# Patient Record
Sex: Female | Born: 1980 | Race: White | Hispanic: No | Marital: Married | Smoking: Current every day smoker
Health system: Southern US, Community
[De-identification: ages and names within clinical notes are randomized; demographics above are authoritative.]

## PROBLEM LIST (undated history)

## (undated) HISTORY — PX: TUBAL LIGATION: SHX77

---

## 2004-03-15 ENCOUNTER — Encounter: Admission: RE | Admit: 2004-03-15 | Discharge: 2004-03-15 | Payer: Self-pay | Admitting: Family Medicine

## 2004-03-29 ENCOUNTER — Encounter: Admission: RE | Admit: 2004-03-29 | Discharge: 2004-03-29 | Payer: Self-pay | Admitting: Family Medicine

## 2011-11-15 ENCOUNTER — Inpatient Hospital Stay (HOSPITAL_COMMUNITY)
Admission: AD | Admit: 2011-11-15 | Discharge: 2011-11-15 | Disposition: A | Discharge status: Home / Self Care | Payer: BC Managed Care – PPO | Source: Ambulatory Visit | Attending: Obstetrics & Gynecology | Admitting: Obstetrics & Gynecology

## 2011-11-15 ENCOUNTER — Inpatient Hospital Stay (HOSPITAL_COMMUNITY): Payer: BC Managed Care – PPO

## 2011-11-15 ENCOUNTER — Encounter (HOSPITAL_COMMUNITY): Payer: Self-pay

## 2011-11-15 DIAGNOSIS — O209 Hemorrhage in early pregnancy, unspecified: Secondary | ICD-10-CM | POA: Insufficient documentation

## 2011-11-15 LAB — ABO/RH: ABO/RH(D): A NEG

## 2011-11-15 LAB — GC/CHLAMYDIA PROBE AMP, GENITAL
Chlamydia, DNA Probe: NEGATIVE
GC Probe Amp, Genital: NEGATIVE

## 2011-11-15 LAB — CBC
Hemoglobin: 12.4 g/dL (ref 12.0–15.0)
MCH: 29.3 pg (ref 26.0–34.0)
RBC: 4.23 MIL/uL (ref 3.87–5.11)
WBC: 10.2 10*3/uL (ref 4.0–10.5)

## 2011-11-15 LAB — URINALYSIS, ROUTINE W REFLEX MICROSCOPIC
Bilirubin Urine: NEGATIVE
Nitrite: NEGATIVE
Specific Gravity, Urine: 1.02 (ref 1.005–1.030)
pH: 6 (ref 5.0–8.0)

## 2011-11-15 LAB — WET PREP, GENITAL: Trich, Wet Prep: NONE SEEN

## 2011-11-15 MED ORDER — RHO D IMMUNE GLOBULIN 1500 UNIT/2ML IJ SOLN
300.0000 ug | Freq: Once | INTRAMUSCULAR | Status: AC
  Administered 2011-11-15: 300 ug via INTRAMUSCULAR
  Filled 2011-11-15: qty 2

## 2011-11-15 NOTE — MAU Note (Signed)
Patient is here with c/o sudden onset of vaginal bleeding. She states that she noticed after voiding at home about ago.

## 2011-11-15 NOTE — Discharge Instructions (Signed)
Vaginal Bleeding During Pregnancy  A small amount of bleeding from the vagina can happen anytime during pregnancy. Be sure to tell your doctor about all vaginal bleeding.   HOME CARE   Get plenty of rest and sleep.   Count the number of pads you use each day. Do not use tampons.   Save any tissue you pass for your doctor to see.   Do not exercise   Do not do any heavy lifting.   Avoid going up and down stairs. If you must climb stairs, go slowly.   Do not have sex (intercourse) or orgasms until approved by your doctor.   Do not douche.   Only take medicine as told by your doctor. Do not take aspirin.   Eat healthy.   Always keep your follow-up appointments.  GET HELP RIGHT AWAY IF:    You feel the baby moving less or not moving at all.   The bleeding gets worse.   You have very painful cramps or pain in your stomach or back.   You pass large clots or anything that looks like tissue.   You have a temperature by mouth above 102 F (38.9 C).   You feel very weak.   You have chills.   You feel dizzy or pass out (faint).   You have a gush of fluid from the vagina.  MAKE SURE YOU:    Understand these instructions.   Will watch your condition.   Will get help right away if you are not doing well or get worse.  Document Released: 04/29/2008 Document Revised: 07/10/2011 Document Reviewed: 06/26/2009  Exit[employer] Patient Information 2012 [employer].

## 2011-11-15 NOTE — MAU Provider Note (Signed)
History   Pt presents today c/o vag bleeding in early preg. She states she has had prenatal care out of town and she went to an ER close to her OB practice but was told the wait was over 4 hours so she decided to come to [facility]. She denies any abd pain, vag irritation, dysuria, fever, or any other sx at this time. She states she has not had intercourse in over a month.  CSN: [id number]  Arrival date and time: 11/15/11 7829   First Provider Initiated Contact with Patient 11/15/11 0043      Chief Complaint  Patient presents with  . Vaginal Bleeding   HPI  OB History    Grav Para Term Preterm Abortions TAB SAB Ect Mult Living   2    1 1           History reviewed. No pertinent past medical history.  History reviewed. No pertinent past surgical history.  History reviewed. No pertinent family history.  History  Substance Use Topics  . Smoking status: Current Everyday Smoker -- 0.5 packs/day  . Smokeless tobacco: Not on file  . Alcohol Use: No    Allergies:  Allergies  Allergen Reactions  . Penicillins Hives  . Percocet (Oxycodone-Acetaminophen) Nausea And Vomiting    No prescriptions prior to admission    Review of Systems  Constitutional: Negative for fever and chills.  Eyes: Negative for blurred vision and double vision.  Cardiovascular: Negative for chest pain and palpitations.  Gastrointestinal: Negative for nausea, vomiting, abdominal pain, diarrhea and constipation.  Genitourinary: Negative for dysuria, urgency, frequency and hematuria.  Neurological: Negative for dizziness and headaches.  Psychiatric/Behavioral: Negative for depression and suicidal ideas.   Physical Exam   Blood pressure 120/82, pulse 96, temperature 97.2 F (36.2 C), temperature source Oral, resp. rate 18, height 5\' 2"  (1.575 m), weight 199 lb 6 oz (90.436 kg), last menstrual period 09/06/2011.  Physical Exam  Nursing note and vitals reviewed. Constitutional: She is oriented to  person, place, and time. She appears well-developed and well-nourished. No distress.  HENT:  Head: Normocephalic and atraumatic.  Eyes: EOM are normal. Pupils are equal, round, and reactive to light.  GI: Soft. She exhibits no distension and no mass. There is no tenderness. There is no rebound and no guarding.  Genitourinary: There is bleeding around the vagina. Vaginal discharge found.       Minimal amount of dark red blood in the vag vault. Cervix Lg/closed.   Neurological: She is alert and oriented to person, place, and time.  Skin: Skin is warm and dry. She is not diaphoretic.  Psychiatric: She has a normal mood and affect. Her behavior is normal. Judgment and thought content normal.    MAU Course  Procedures  Wet prep and GC/Chlamydia cultures done.  Results for orders placed during the hospital encounter of 11/15/11 (from the past 24 hour(s))  URINALYSIS, ROUTINE W REFLEX MICROSCOPIC     Status: Abnormal   Collection Time   11/15/11 12:16 AM      Component Value Range   Color, Urine YELLOW  YELLOW    APPearance CLEAR  CLEAR    Specific Gravity, Urine 1.020  1.005 - 1.030    pH 6.0  5.0 - 8.0    Glucose, UA NEGATIVE  NEGATIVE (mg/dL)   Hgb urine dipstick LARGE (*) NEGATIVE    Bilirubin Urine NEGATIVE  NEGATIVE    Ketones, ur NEGATIVE  NEGATIVE (mg/dL)   Protein, ur NEGATIVE  NEGATIVE (mg/dL)   Urobilinogen, UA 0.2  0.0 - 1.0 (mg/dL)   Nitrite NEGATIVE  NEGATIVE    Leukocytes, UA SMALL (*) NEGATIVE   URINE MICROSCOPIC-ADD ON     Status: Abnormal   Collection Time   11/15/11 12:16 AM      Component Value Range   Squamous Epithelial / LPF MANY (*) RARE    WBC, UA 3-6  <3 (WBC/hpf)   RBC / HPF 3-6  <3 (RBC/hpf)   Bacteria, UA RARE  RARE    Urine-Other MUCOUS PRESENT    POCT PREGNANCY, URINE     Status: Abnormal   Collection Time   11/15/11 12:40 AM      Component Value Range   Preg Test, Ur POSITIVE (*) NEGATIVE   WET PREP, GENITAL     Status: Abnormal   Collection Time    11/15/11 12:40 AM      Component Value Range   Yeast Wet Prep HPF POC NONE SEEN  NONE SEEN    Trich, Wet Prep NONE SEEN  NONE SEEN    Clue Cells Wet Prep HPF POC FEW (*) NONE SEEN    WBC, Wet Prep HPF POC FEW (*) NONE SEEN   CBC     Status: Normal   Collection Time   11/15/11 12:41 AM      Component Value Range   WBC 10.2  4.0 - 10.5 (K/uL)   RBC 4.23  3.87 - 5.11 (MIL/uL)   Hemoglobin 12.4  12.0 - 15.0 (g/dL)   HCT 16.1  09.6 - 04.5 (%)   MCV 89.4  78.0 - 100.0 (fL)   MCH 29.3  26.0 - 34.0 (pg)   MCHC 32.8  30.0 - 36.0 (g/dL)   RDW 40.9  81.1 - 91.4 (%)   Platelets 190  150 - 400 (K/uL)  ABO/RH     Status: Normal   Collection Time   11/15/11 12:41 AM      Component Value Range   ABO/RH(D) A NEG    RH IG WORKUP     Status: Normal (Preliminary result)   Collection Time   11/15/11 12:41 AM      Component Value Range   Gestational Age(Wks) 10     ABO/RH(D) A NEG     Antibody Screen NEG     Unit Number [id number]     Blood Component Type RHIG     Unit division 00     Status of Unit ISSUED     Transfusion Status OK TO TRANSFUSE     US shows single IUP consistent with EDC. No obvious bleeding or etiology for bleeding noted.  Rhophylac given.  Assessment and Plan  Bleeding in preg: discussed with pt at length. She will f/u with her OB provider. Discussed diet, activity, risks, and precautions. She will remain on pelvic rest.  Clinton Gallant. Yelina Sarratt III, DrHSc, MPAS, PA-C  11/15/2011, 12:46 AM

## 2011-11-16 LAB — RH IG WORKUP (INCLUDES ABO/RH)
ABO/RH(D): A NEG
Antibody Screen: NEGATIVE
Gestational Age(Wks): 10

## 2011-11-17 LAB — URINE CULTURE: Culture  Setup Time: 201304131127

## 2011-11-18 NOTE — MAU Provider Note (Signed)
Attestation of Attending Supervision of Advanced Practitioner: Evaluation and management procedures were performed by the OB Fellow/PA/CNM/NP under my supervision and collaboration. Chart reviewed, and agree with management and plan.  Gabriella Woodhead, M.D. 11/18/2011 11:31 AM   

## 2013-03-15 IMAGING — US US OB COMP LESS 14 WK
1 series · 13 of 17 positions shown · non-contrast
Comparison: none

[Series 1: us ob comp less 14 wks · 17 acquisitions, 13 frames shown]
[im 1/17]
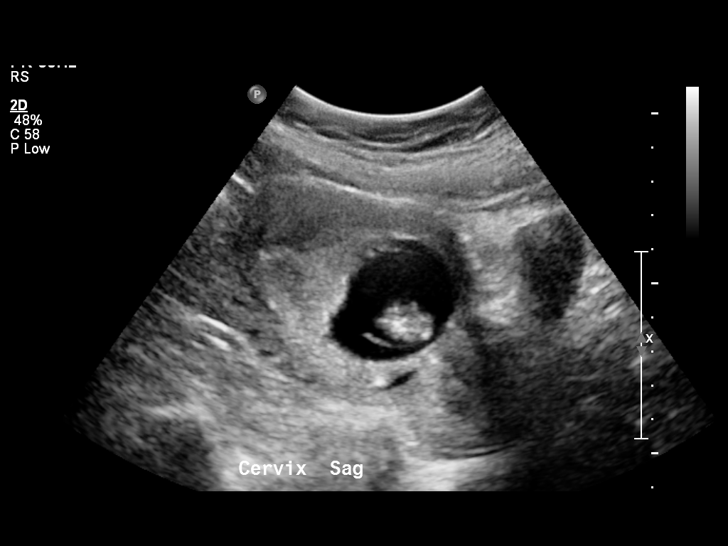
[im 2/17]
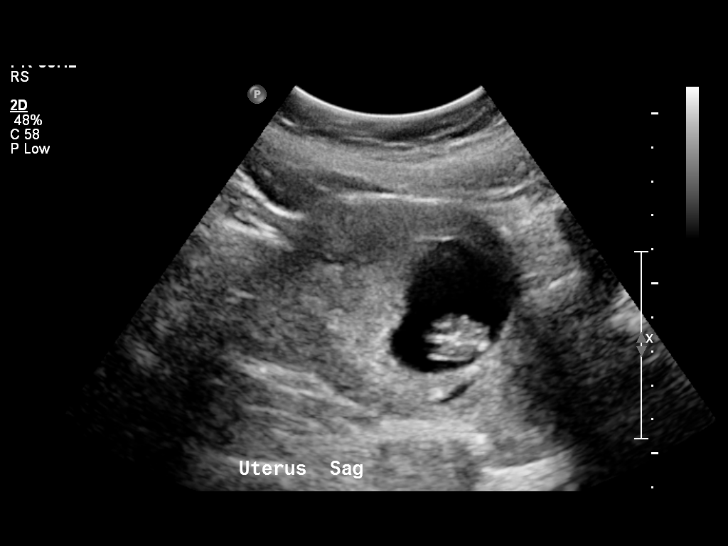
[im 4/17]
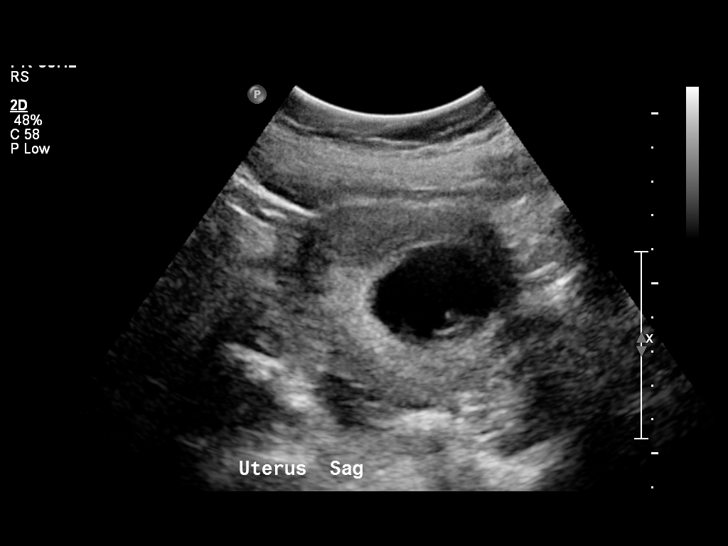
[im 5/17]
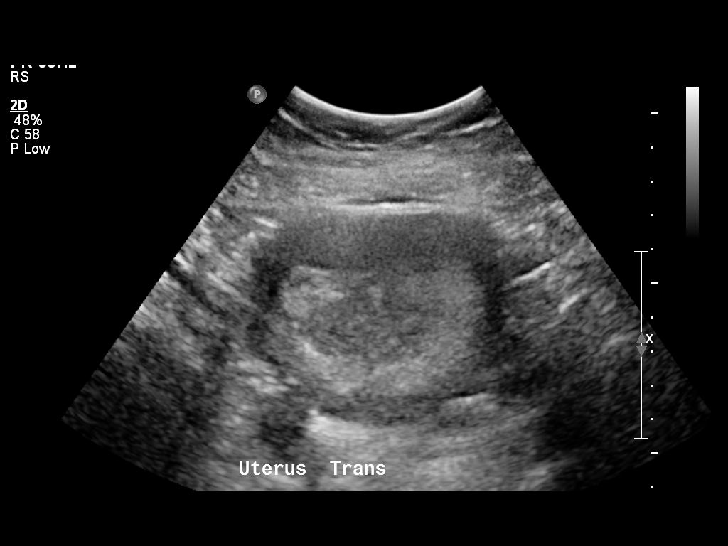
[im 6/17]
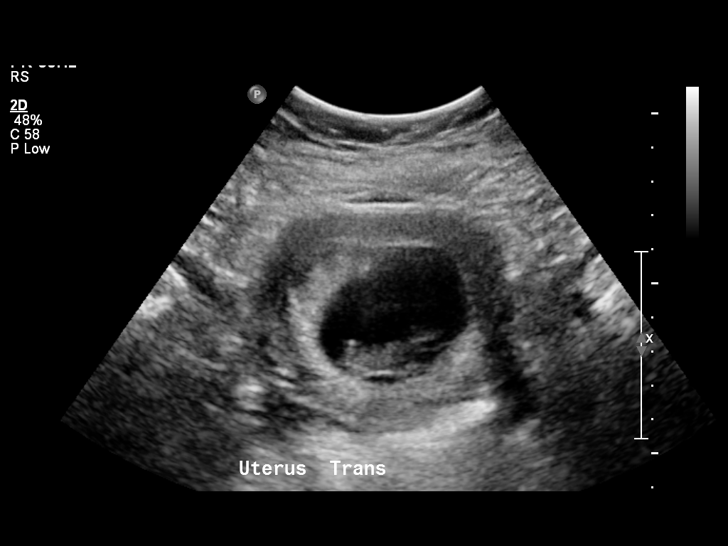
[im 8/17]
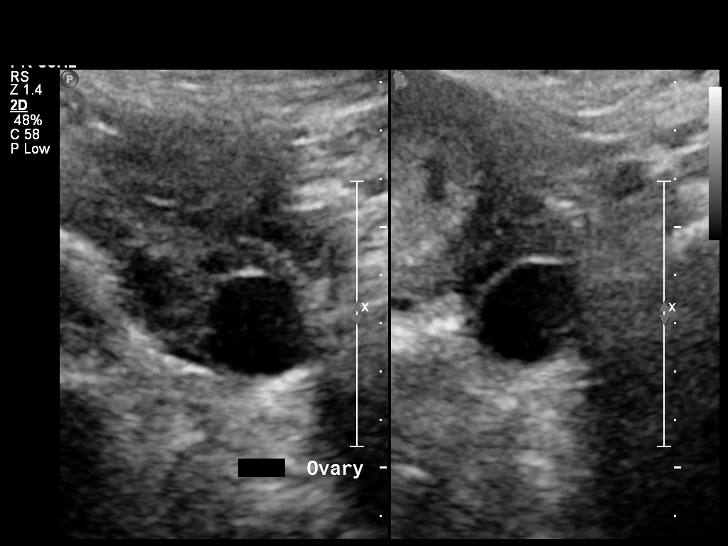
[im 9/17]
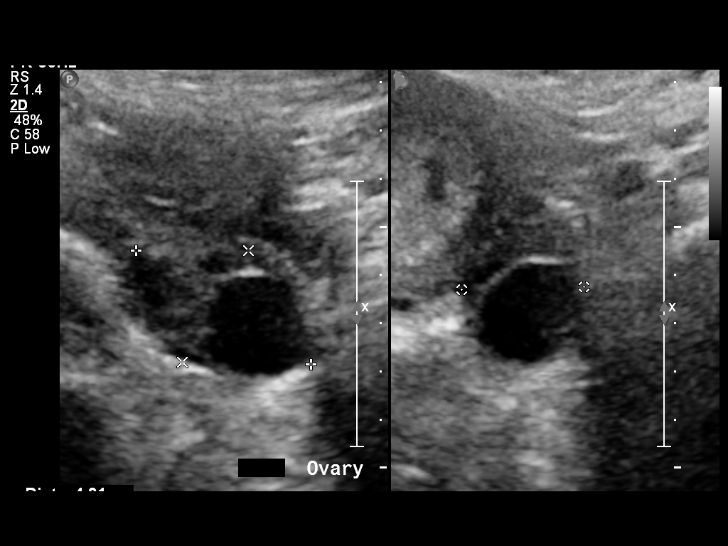
[im 10/17]
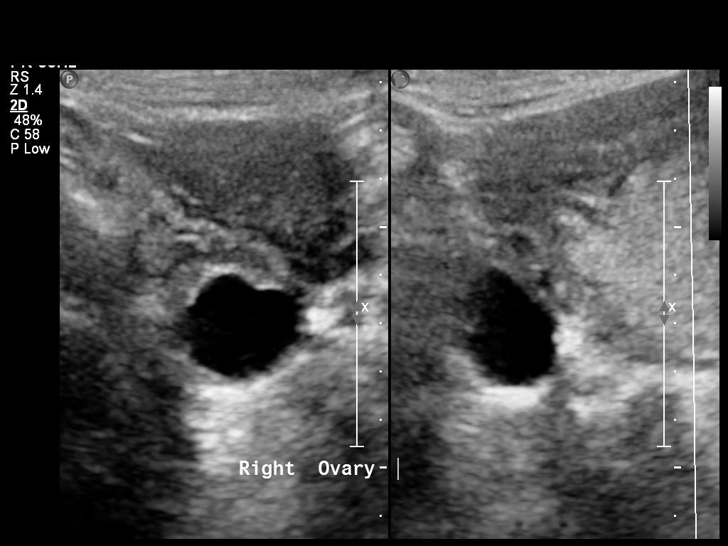
[im 12/17]
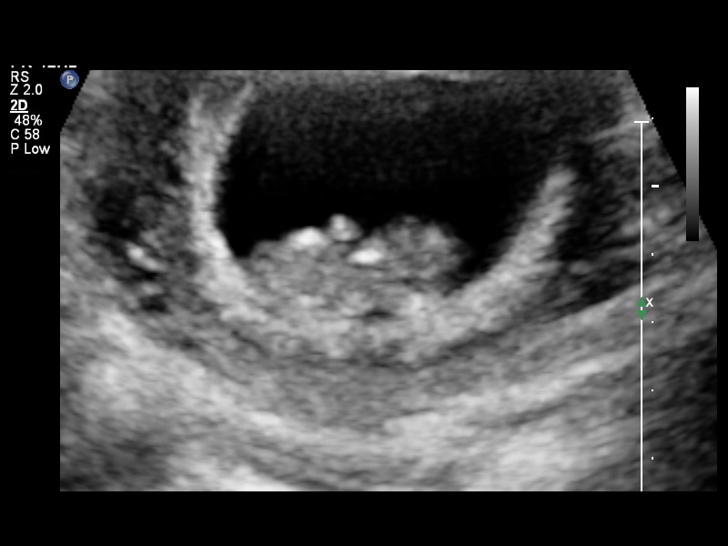
[im 13/17]
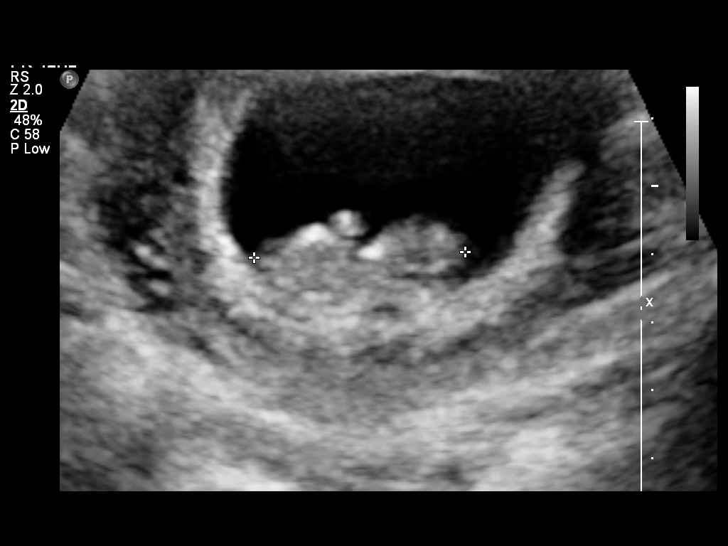
[im 14/17]
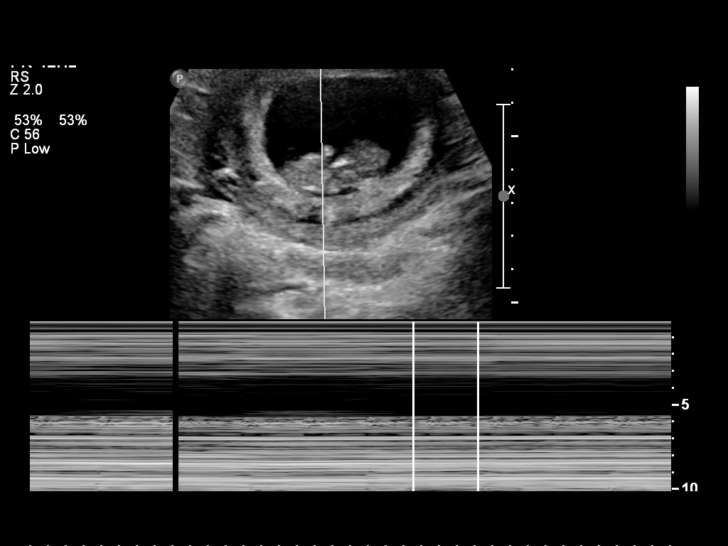
[im 16/17]
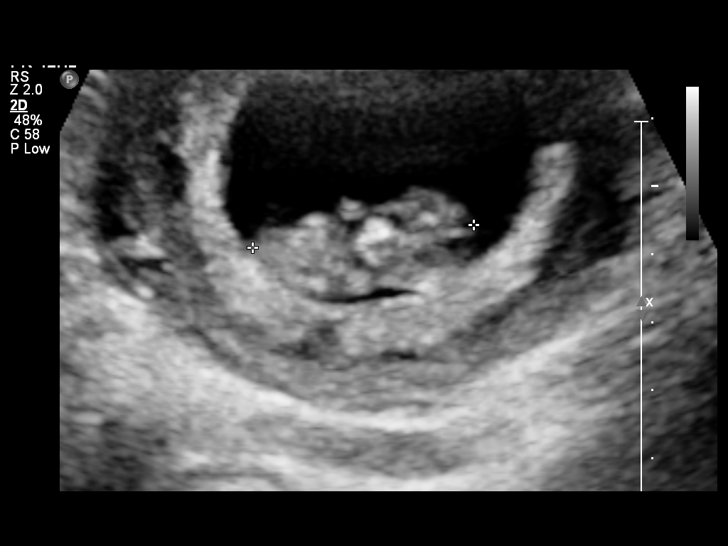
[im 17/17]
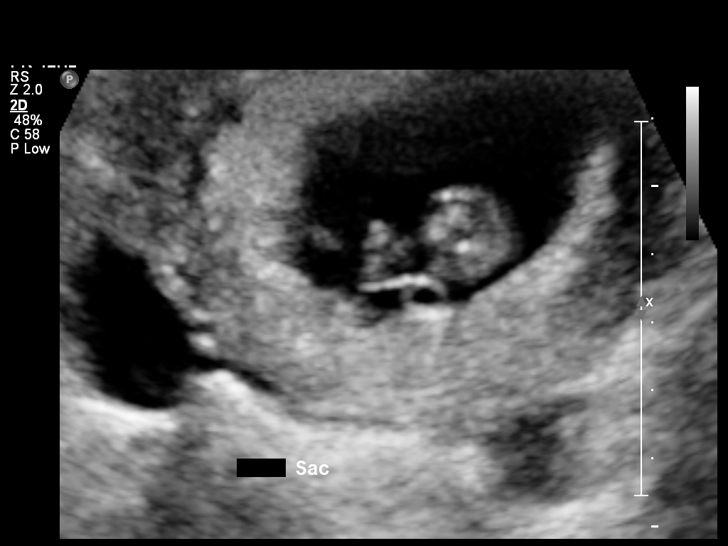

[13 of 17 positions shown; findings below may reference images not displayed]

OBSTETRICS REPORT
                      (Signed Final 11/15/2011 [DATE])

Procedures

 US OB COMP LESS 14 WKS                                76801.0
Indications

 Vaginal bleeding, unknown etiology
Fetal Evaluation

 Preg. Location:    Intrauterine
 Yolk Sac:          Visualized
 Fetal Pole:        Visualized
 Fetal Heart Rate:  165                         bpm
 Cardiac Activity:  Observed

 Amniotic Fluid
 AFI FV:      Subjectively within normal limits
Biometry

 CRL:     31.7  mm    G. Age:   9w 6d                  EDD:   06/13/12
Gestational Age

 LMP:           10w 0d       Date:   09/06/11                 EDD:   06/12/12
 Best:          10w 0d    Det. By:   LMP  (09/06/11)          EDD:   06/12/12
Cervix Uterus Adnexa

 Cervix:       Normal appearance by transabdominal scan.
 Uterus:       No abnormality visualized.
 Cul De Sac:   No free fluid seen.
 Left Ovary:   Within normal limits.
 Right Ovary:  Within normal limits.
 Adnexa:     No abnormality visualized.
Impression

 Single living IUP.  US EGA is concordant with LMP.
 No significant maternal uterine or adnexal abnormality
 identified.
 Thank you for sharing in the care of Ms. DYZIO RAUBO with
 questions or concerns.

## 2014-06-05 ENCOUNTER — Encounter (HOSPITAL_COMMUNITY): Payer: Self-pay

## 2019-03-02 ENCOUNTER — Ambulatory Visit
Admission: EM | Admit: 2019-03-02 | Discharge: 2019-03-02 | Disposition: A | Discharge status: Home / Self Care | Payer: BC Managed Care – PPO | Attending: Emergency Medicine | Admitting: Emergency Medicine

## 2019-03-02 ENCOUNTER — Other Ambulatory Visit: Payer: Self-pay

## 2019-03-02 DIAGNOSIS — N898 Other specified noninflammatory disorders of vagina: Secondary | ICD-10-CM | POA: Insufficient documentation

## 2019-03-02 DIAGNOSIS — R300 Dysuria: Secondary | ICD-10-CM | POA: Diagnosis present

## 2019-03-02 DIAGNOSIS — R1030 Lower abdominal pain, unspecified: Secondary | ICD-10-CM

## 2019-03-02 DIAGNOSIS — Z3202 Encounter for pregnancy test, result negative: Secondary | ICD-10-CM

## 2019-03-02 DIAGNOSIS — R350 Frequency of micturition: Secondary | ICD-10-CM

## 2019-03-02 LAB — POCT URINALYSIS DIP (MANUAL ENTRY)
Bilirubin, UA: NEGATIVE
Glucose, UA: NEGATIVE mg/dL
Ketones, POC UA: NEGATIVE mg/dL
Nitrite, UA: NEGATIVE
Protein Ur, POC: NEGATIVE mg/dL
Spec Grav, UA: 1.025 (ref 1.010–1.025)
Urobilinogen, UA: 0.2 E.U./dL
pH, UA: 6.5 (ref 5.0–8.0)

## 2019-03-02 LAB — POCT URINE PREGNANCY: Preg Test, Ur: NEGATIVE

## 2019-03-02 MED ORDER — PHENAZOPYRIDINE HCL 200 MG PO TABS: 200.0000 mg | ORAL_TABLET | Freq: Three times a day (TID) | ORAL | 0 refills | Status: AC

## 2019-03-02 MED ORDER — METRONIDAZOLE 500 MG PO TABS: 500.0000 mg | ORAL_TABLET | Freq: Two times a day (BID) | ORAL | 0 refills | Status: AC

## 2019-03-02 MED ORDER — NITROFURANTOIN MONOHYD MACRO 100 MG PO CAPS: 100.0000 mg | ORAL_CAPSULE | Freq: Two times a day (BID) | ORAL | 0 refills | Status: AC

## 2019-03-02 NOTE — ED Triage Notes (Signed)
C/o uti symptoms since Sunday

## 2019-03-02 NOTE — Discharge Instructions (Addendum)
Urine did show signs of infection Declines vaginal swab today Urine culture sent.  We will call you with abnormal results.   Push fluids and get plenty of rest.   Take antibiotic for UTI and possible BV as directed and to completion Take pyridium as prescribed and as needed for symptomatic relief Follow up with PCP if symptoms persists Return here or go to ER if you have any new or worsening symptoms such as fever, abdominal pain, nausea/vomiting, flank pain, etc..Marland Kitchen

## 2019-03-02 NOTE — ED Provider Notes (Signed)
[facility]   CC: Burning with urination  SUBJECTIVE:  Marcia Cortez is a 38 y.o. female who complains of dysuria, increased frequency, thick white vaginal discharge, and vaginal odor x 3 days.  Admits to recent sexual activity prior to symptoms.  Denies concern for STDs.  States she is in a monogamous relationship with her husband.  Also complains of lower abdominal pressure.  Has NOT tried OTC medications.  Symptoms are made worse with urination.  Admits to similar symptoms in the past with UTI and BV.  Denies fever, chills, nausea, vomiting, abdominal pain, flank pain, pelvic or vaginal pain, abnormal vaginal bleeding, hematuria, rashes or lesions.    LMP: Patient's last menstrual period was 02/16/2019. Tubal ligation.    ROS: As in HPI.  All other pertinent ROS negative.     No past medical history on file. Past Surgical History:  Procedure Laterality Date  . TUBAL LIGATION     Allergies  Allergen Reactions  . Penicillins Hives  . Percocet [Oxycodone-Acetaminophen] Nausea And Vomiting   No current facility-administered medications on file prior to encounter.    Current Outpatient Medications on File Prior to Encounter  Medication Sig Dispense Refill  . acetaminophen (TYLENOL) 325 MG tablet Take 650 mg by mouth every 6 (six) hours as needed. As needed for pain    . COSENTYX, 300 MG DOSE, 150 MG/ML SOSY Take 300 mg by mouth every 30 (thirty) days.     Social History   Socioeconomic History  . Marital status: Married    Spouse name: Not on file  . Number of children: Not on file  . Years of education: Not on file  . Highest education level: Not on file  Occupational History  . Not on file  Social Needs  . Financial resource strain: Not on file  . Food insecurity    Worry: Not on file    Inability: Not on file  . Transportation needs    Medical: Not on file    Non-medical: Not on file  Tobacco Use  . Smoking status: Current Every Day Smoker    Packs/day:  0.50  Substance and Sexual Activity  . Alcohol use: No  . Drug use: No  . Sexual activity: Not Currently  Lifestyle  . Physical activity    Days per week: Not on file    Minutes per session: Not on file  . Stress: Not on file  Relationships  . Social Musicianconnections    Talks on phone: Not on file    Gets together: Not on file    Attends religious service: Not on file    Active member of club or organization: Not on file    Attends meetings of clubs or organizations: Not on file    Relationship status: Not on file  . Intimate partner violence    Fear of current or ex partner: Not on file    Emotionally abused: Not on file    Physically abused: Not on file    Forced sexual activity: Not on file  Other Topics Concern  . Not on file  Social History Narrative  . Not on file   No family history on file.  OBJECTIVE:  Vitals:   03/02/19 1618  BP: 113/80  Pulse: (!) 109  Resp: 18  Temp: 98.3 F (36.8 C)  SpO2: 98%   General appearance: Alert in no acute distress HEENT: NCAT.  PERRL. Oropharynx clear.  Lungs: clear to auscultation bilaterally without adventitious breath sounds  Heart: regular rate and rhythm.   Abdomen: soft; non-distended; no tenderness; bowel sounds present; no guarding Back: no CVA tenderness Extremities: no edema; symmetrical with no gross deformities Skin: warm and dry Neurologic: Ambulates from chair to exam table without difficulty Psychological: alert and cooperative; normal mood and affect  Labs Reviewed  POCT URINALYSIS DIP (MANUAL ENTRY) - Abnormal; Notable for the following components:      Result Value   Clarity, UA cloudy (*)    Blood, UA trace-lysed (*)    Leukocytes, UA Small (1+) (*)    All other components within normal limits  URINE CULTURE  POCT URINE PREGNANCY    ASSESSMENT & PLAN:  1. Dysuria   2. Urinary frequency   3. Vaginal discharge     Meds ordered this encounter  Medications  . nitrofurantoin,  macrocrystal-monohydrate, (MACROBID) 100 MG capsule    Sig: Take 1 capsule (100 mg total) by mouth 2 (two) times daily for 5 days.    Dispense:  10 capsule    Refill:  0    Order Specific Question:   Supervising Provider    Answer:   Raylene Everts [[id number]]  . phenazopyridine (PYRIDIUM) 200 MG tablet    Sig: Take 1 tablet (200 mg total) by mouth 3 (three) times daily.    Dispense:  6 tablet    Refill:  0    Order Specific Question:   Supervising Provider    Answer:   Raylene Everts [[id number]]  . metroNIDAZOLE (FLAGYL) 500 MG tablet    Sig: Take 1 tablet (500 mg total) by mouth 2 (two) times daily.    Dispense:  14 tablet    Refill:  0    Order Specific Question:   Supervising Provider    Answer:   Raylene Everts [[id number]]   Urine did show signs of infection Urine culture sent.  We will call you with abnormal results.   Declines vaginal swab today Push fluids and get plenty of rest.   Take macrobid for UTI and metronidazole for possible BV as directed and to completion Take pyridium as prescribed and as needed for symptomatic relief Follow up with PCP if symptoms persists Return here or go to ER if you have any new or worsening symptoms such as fever, abdominal pain, nausea/vomiting, flank pain, etc...  Outlined signs and symptoms indicating need for more acute intervention. Patient verbalized understanding. After Visit Summary given.     Lestine Box, PA-C 03/02/19 1726

## 2019-03-05 LAB — URINE CULTURE: Culture: >=100000 — AB

## 2019-03-07 ENCOUNTER — Telehealth (HOSPITAL_COMMUNITY): Payer: Self-pay | Admitting: Emergency Medicine

## 2019-03-07 NOTE — Telephone Encounter (Signed)
Urine culture was positive for e coli and was given  macrobid at urgent care visit. Attempted to reach patient. No answer at this time.   

## 2019-10-31 ENCOUNTER — Ambulatory Visit (HOSPITAL_COMMUNITY)
Admission: EM | Admit: 2019-10-31 | Discharge: 2019-10-31 | Disposition: A | Discharge status: Home / Self Care | Payer: BC Managed Care – PPO | Attending: Family Medicine | Admitting: Family Medicine

## 2019-10-31 ENCOUNTER — Encounter (HOSPITAL_COMMUNITY): Payer: Self-pay

## 2019-10-31 ENCOUNTER — Other Ambulatory Visit: Payer: Self-pay

## 2019-10-31 DIAGNOSIS — R109 Unspecified abdominal pain: Secondary | ICD-10-CM | POA: Diagnosis present

## 2019-10-31 DIAGNOSIS — M545 Low back pain, unspecified: Secondary | ICD-10-CM

## 2019-10-31 DIAGNOSIS — Z3202 Encounter for pregnancy test, result negative: Secondary | ICD-10-CM

## 2019-10-31 DIAGNOSIS — R110 Nausea: Secondary | ICD-10-CM | POA: Diagnosis present

## 2019-10-31 LAB — POCT URINALYSIS DIP (DEVICE)
Bilirubin Urine: NEGATIVE
Glucose, UA: NEGATIVE mg/dL
Ketones, ur: NEGATIVE mg/dL
Nitrite: NEGATIVE
Protein, ur: NEGATIVE mg/dL
Specific Gravity, Urine: 1.02 (ref 1.005–1.030)
Urobilinogen, UA: 0.2 mg/dL (ref 0.0–1.0)
pH: 8 (ref 5.0–8.0)

## 2019-10-31 LAB — POCT PREGNANCY, URINE: Preg Test, Ur: NEGATIVE

## 2019-10-31 LAB — POC URINE PREG, ED
Preg Test, Ur: NEGATIVE
Preg Test, Ur: NEGATIVE

## 2019-10-31 MED ORDER — CYCLOBENZAPRINE HCL 5 MG PO TABS: 5.0000 mg | ORAL_TABLET | Freq: Two times a day (BID) | ORAL | 0 refills | Status: AC | PRN

## 2019-10-31 MED ORDER — NAPROXEN 500 MG PO TABS: 500.0000 mg | ORAL_TABLET | Freq: Two times a day (BID) | ORAL | 0 refills | Status: AC

## 2019-10-31 MED ORDER — TAMSULOSIN HCL 0.4 MG PO CAPS: 0.4000 mg | ORAL_CAPSULE | Freq: Every day | ORAL | 0 refills | Status: AC

## 2019-10-31 MED ORDER — SULFAMETHOXAZOLE-TRIMETHOPRIM 800-160 MG PO TABS: 1.0000 | ORAL_TABLET | Freq: Two times a day (BID) | ORAL | 0 refills | Status: AC

## 2019-10-31 NOTE — ED Triage Notes (Signed)
C/o lower back pain, today more specifically in the left lower back radiating into her hip and buttock. Patient states she has a hx of kidney infections and kidney stones and this feels similar.

## 2019-10-31 NOTE — ED Provider Notes (Signed)
[facility]    CSN: [id number] Arrival date & time: 10/31/19  0945      History   Chief Complaint Chief Complaint  Patient presents with  . Back Pain   HPI Marcia Cortez is a 39 y.o. female history of prior tubal ligation presenting today for evaluation of left back and flank pain.  Patient notes that beginning Saturday evening she developed pain in her left lower back.  Denies any specific injury trauma or fall.  Denies increase in activity or heavy lifting.  Denies radiation into legs.  Denies numbness or tingling.  She does report prior history of UTIs and nephrolithiasis.  Yesterday pain was intense that it caused nausea.  Felt feverish yesterday.  Pain and symptoms slightly improved today.  Denies any dysuria, increased frequency or urgency.  Denies hematuria.  Has had some abdominal cramping associated with this.  HPI  History reviewed. No pertinent past medical history.  There are no problems to display for this patient.   Past Surgical History:  Procedure Laterality Date  . TUBAL LIGATION      OB History    Gravida  2   Para      Term      Preterm      AB  1   Living        SAB      TAB  1   Ectopic      Multiple      Live Births               Home Medications    Prior to Admission medications   Medication Sig Start Date End Date Taking? Authorizing Provider  acetaminophen (TYLENOL) 325 MG tablet Take 650 mg by mouth every 6 (six) hours as needed. As needed for pain    [provider]  COSENTYX, 300 MG DOSE, 150 MG/ML SOSY Take 300 mg by mouth every 30 (thirty) days. 12/06/18   [provider]  cyclobenzaprine (FLEXERIL) 5 MG tablet Take 1-2 tablets (5-10 mg total) by mouth 2 (two) times daily as needed for muscle spasms. 10/31/19   Myah Guynes C, PA-C  metroNIDAZOLE (FLAGYL) 500 MG tablet Take 1 tablet (500 mg total) by mouth 2 (two) times daily. 03/02/19   Wurst, [place], PA-C  naproxen (NAPROSYN) 500 MG tablet  Take 1 tablet (500 mg total) by mouth 2 (two) times daily. 10/31/19   Kimmie Berggren C, PA-C  phenazopyridine (PYRIDIUM) 200 MG tablet Take 1 tablet (200 mg total) by mouth 3 (three) times daily. 03/02/19   Wurst, [place], PA-C  sulfamethoxazole-trimethoprim (BACTRIM DS) 800-160 MG tablet Take 1 tablet by mouth 2 (two) times daily for 3 days. 10/31/19 11/03/19  Kareena Arrambide C, PA-C  tamsulosin (FLOMAX) 0.4 MG CAPS capsule Take 1 capsule (0.4 mg total) by mouth daily. 10/31/19   Carnita Golob, Junius Creamer, PA-C    Family History No family history on file.  Social History Social History   Tobacco Use  . Smoking status: Current Every Day Smoker    Packs/day: 0.50  . Smokeless tobacco: Never Used  Substance Use Topics  . Alcohol use: No  . Drug use: No     Allergies   Penicillins and Percocet [oxycodone-acetaminophen]   Review of Systems Review of Systems  Constitutional: Negative for fever.  Respiratory: Negative for shortness of breath.   Cardiovascular: Negative for chest pain.  Gastrointestinal: Positive for abdominal pain and nausea. Negative for diarrhea and vomiting.  Genitourinary: Positive for flank  pain. Negative for dysuria, frequency, genital sores, hematuria, menstrual problem, vaginal bleeding, vaginal discharge and vaginal pain.  Musculoskeletal: Positive for back pain and myalgias.  Skin: Negative for rash.  Neurological: Negative for dizziness, light-headedness and headaches.     Physical Exam Triage Vital Signs ED Triage Vitals  Enc Vitals Group     BP 10/31/19 1005 121/82     Pulse Rate 10/31/19 1005 91     Resp 10/31/19 1005 17     Temp 10/31/19 1005 98.3 F (36.8 C)     Temp Source 10/31/19 1005 Oral     SpO2 10/31/19 1005 99 %     Weight 10/31/19 1003 187 lb (84.8 kg)     Height --      Head Circumference --      Peak Flow --      Pain Score 10/31/19 1003 6     Pain Loc --      Pain Edu? --      Excl. in [place]? --    No data found.  Updated Vital  Signs BP 121/82 (BP Location: Right Arm)   Pulse 91   Temp 98.3 F (36.8 C) (Oral)   Resp 17   Wt 187 lb (84.8 kg)   LMP 10/17/2019 (Within Days)   SpO2 99%   Breastfeeding No   BMI 34.20 kg/m   Visual Acuity Right Eye Distance:   Left Eye Distance:   Bilateral Distance:    Right Eye Near:   Left Eye Near:    Bilateral Near:     Physical Exam Vitals and nursing note reviewed.  Constitutional:      Appearance: She is well-developed.     Comments: No acute distress  HENT:     Head: Normocephalic and atraumatic.     Nose: Nose normal.  Eyes:     Conjunctiva/sclera: Conjunctivae normal.  Cardiovascular:     Rate and Rhythm: Normal rate.  Pulmonary:     Effort: Pulmonary effort is normal. No respiratory distress.  Abdominal:     General: There is no distension.     Comments: Soft, nondistended, tender to palpation to left middle abdomen, no focal tenderness, negative rebound, negative Rovsing, negative McBurney's  Musculoskeletal:        General: Normal range of motion.     Cervical back: Neck supple.     Comments: Mild tenderness to palpation to lower lumbar spine midline, tenderness throughout left lateral lumbar musculature  Strength at hips and knees 5/5 and equal bilaterally, negative straight leg raise, patellar reflex 2+ bilaterally  Skin:    General: Skin is warm and dry.  Neurological:     Mental Status: She is alert and oriented to person, place, and time.      UC Treatments / Results  Labs (all labs ordered are listed, but only abnormal results are displayed) Labs Reviewed  POCT URINALYSIS DIP (DEVICE) - Abnormal; Notable for the following components:      Result Value   Hgb urine dipstick TRACE (*)    Leukocytes,Ua SMALL (*)    All other components within normal limits  URINE CULTURE  POC URINE PREG, ED  POCT PREGNANCY, URINE  POC URINE PREG, ED    EKG   Radiology No results found.  Procedures Procedures (including critical care  time)  Medications Ordered in UC Medications - No data to display  Initial Impression / Assessment and Plan / UC Course  I have reviewed the triage vital signs and  the nursing notes.  Pertinent labs & imaging results that were available during my care of the patient were reviewed by me and considered in my medical decision making (see chart for details).     Small leuks, trace hemoglobin, cannot rule out UTI versus stone.  Urine culture pending.  Does have some reproducible tenderness making symptoms more suggestive of MSK etiology.  Recommending anti-inflammatories muscle relaxers.  Will also provide Flomax and strainer to monitor urine over the next couple days given his history of prior stones.  Bactrim twice daily x3 days to treat for UTI.  Discussed strict return precautions. Patient verbalized understanding and is agreeable with plan.  Final Clinical Impressions(s) / UC Diagnoses   Final diagnoses:  Acute left-sided low back pain without sciatica  Left flank pain  Nausea without vomiting     Discharge Instructions     I suspect back pain likely muscular strain but there was a small amount of bacteria and blood in urine that could be from infection/stone  Naprosyn twice daily with food for pain You may use flexeril as needed to help with pain. This is a muscle relaxer and causes sedation- please use only at bedtime or when you will be home and not have to drive/work Begin flomax daily and strain urine Bactrim twice daily for 3 days for UTI, urine culture pending to confirm- we will call if needed to change medicines  Follow up if any symptoms not improving or worsening   ED Prescriptions    Medication Sig Dispense Auth. Provider   naproxen (NAPROSYN) 500 MG tablet Take 1 tablet (500 mg total) by mouth 2 (two) times daily. 30 tablet Criag Wicklund C, PA-C   cyclobenzaprine (FLEXERIL) 5 MG tablet Take 1-2 tablets (5-10 mg total) by mouth 2 (two) times daily as needed for  muscle spasms. 24 tablet Daquon Greenleaf C, PA-C   tamsulosin (FLOMAX) 0.4 MG CAPS capsule Take 1 capsule (0.4 mg total) by mouth daily. 10 capsule Chamille Werntz C, PA-C   sulfamethoxazole-trimethoprim (BACTRIM DS) 800-160 MG tablet Take 1 tablet by mouth 2 (two) times daily for 3 days. 6 tablet Rollyn Scialdone, [place] C, PA-C     PDMP not reviewed this encounter.   Sharyon Cable Hilham C, PA-C 10/31/19 1048

## 2019-10-31 NOTE — Discharge Instructions (Signed)
I suspect back pain likely muscular strain but there was a small amount of bacteria and blood in urine that could be from infection/stone  Naprosyn twice daily with food for pain You may use flexeril as needed to help with pain. This is a muscle relaxer and causes sedation- please use only at bedtime or when you will be home and not have to drive/work Begin flomax daily and strain urine Bactrim twice daily for 3 days for UTI, urine culture pending to confirm- we will call if needed to change medicines  Follow up if any symptoms not improving or worsening

## 2019-11-01 LAB — URINE CULTURE: Culture: NO GROWTH
